# Patient Record
Sex: Male | Born: 1995 | Race: White | Hispanic: No | Marital: Single | Smoking: Current every day smoker
Health system: Southern US, Community
[De-identification: ages and names within clinical notes are randomized; demographics above are authoritative.]

## PROBLEM LIST (undated history)

## (undated) DIAGNOSIS — J45909 Unspecified asthma, uncomplicated: Secondary | ICD-10-CM

---
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
## 2013-04-11 ENCOUNTER — Emergency Department: Payer: Self-pay | Admitting: Emergency Medicine

## 2013-04-11 LAB — CBC
HCT: 47.6 % (ref 40.0–52.0)
HGB: 16.6 g/dL (ref 13.0–18.0)
MCH: 28.9 pg (ref 26.0–34.0)
MCHC: 34.7 g/dL (ref 32.0–36.0)
Platelet: 223 10*3/uL (ref 150–440)
RBC: 5.73 10*6/uL (ref 4.40–5.90)
RDW: 13.6 % (ref 11.5–14.5)

## 2013-04-11 LAB — DRUG SCREEN, URINE
Barbiturates, Ur Screen: NEGATIVE (ref ?–200)
Benzodiazepine, Ur Scrn: NEGATIVE (ref ?–200)
Cocaine Metabolite,Ur ~~LOC~~: NEGATIVE (ref ?–300)
MDMA (Ecstasy)Ur Screen: NEGATIVE (ref ?–500)
Methadone, Ur Screen: NEGATIVE (ref ?–300)
Opiate, Ur Screen: NEGATIVE (ref ?–300)
Phencyclidine (PCP) Ur S: NEGATIVE (ref ?–25)
Tricyclic, Ur Screen: NEGATIVE (ref ?–1000)

## 2013-04-11 LAB — URINALYSIS, COMPLETE
Bilirubin,UR: NEGATIVE
Blood: NEGATIVE
Glucose,UR: NEGATIVE mg/dL (ref 0–75)
Ketone: NEGATIVE
Leukocyte Esterase: NEGATIVE
Nitrite: NEGATIVE
Ph: 6 (ref 4.5–8.0)
RBC,UR: 1 /HPF (ref 0–5)
Specific Gravity: 1.02 (ref 1.003–1.030)
Squamous Epithelial: NONE SEEN
WBC UR: 1 /HPF (ref 0–5)

## 2013-04-11 LAB — COMPREHENSIVE METABOLIC PANEL
BUN: 13 mg/dL (ref 7–18)
Bilirubin,Total: 0.4 mg/dL (ref 0.2–1.0)
Chloride: 105 mmol/L (ref 98–107)
Creatinine: 0.91 mg/dL (ref 0.60–1.30)
EGFR (Non-African Amer.): >60
Glucose: 99 mg/dL (ref 65–99)
Osmolality: 274 (ref 275–301)
Potassium: 3.6 mmol/L (ref 3.5–5.1)
SGPT (ALT): 17 U/L (ref 12–78)
Total Protein: 7.8 g/dL (ref 6.4–8.6)

## 2013-04-11 LAB — ETHANOL: Ethanol %: <0.003 % (ref 0.000–0.080)

## 2013-04-11 LAB — TSH: Thyroid Stimulating Horm: 0.484 u[IU]/mL

## 2013-06-07 ENCOUNTER — Emergency Department: Payer: Self-pay | Admitting: Emergency Medicine

## 2013-06-18 ENCOUNTER — Emergency Department: Payer: Self-pay | Admitting: Emergency Medicine

## 2014-03-24 ENCOUNTER — Emergency Department: Payer: Self-pay | Admitting: Emergency Medicine

## 2014-11-29 ENCOUNTER — Encounter: Payer: Self-pay | Admitting: Emergency Medicine

## 2014-11-29 ENCOUNTER — Emergency Department
Admission: EM | Admit: 2014-11-29 | Discharge: 2014-11-29 | Disposition: A | Discharge status: Home / Self Care | Payer: Self-pay | Attending: Internal Medicine | Admitting: Internal Medicine

## 2014-11-29 DIAGNOSIS — Z720 Tobacco use: Secondary | ICD-10-CM | POA: Insufficient documentation

## 2014-11-29 DIAGNOSIS — L237 Allergic contact dermatitis due to plants, except food: Secondary | ICD-10-CM

## 2014-11-29 MED ORDER — HYDROXYZINE PAMOATE 25 MG PO CAPS: 25.0000 mg | ORAL_CAPSULE | ORAL | Status: DC | PRN

## 2014-11-29 MED ORDER — PREDNISONE 10 MG PO TABS
ORAL_TABLET | ORAL | Status: DC
Start: 2014-11-29 — End: 2015-08-27

## 2014-11-29 MED ORDER — METHYLPREDNISOLONE SODIUM SUCC 125 MG IJ SOLR
INTRAMUSCULAR | Status: AC
  Administered 2014-11-29: 125 mg via INTRAMUSCULAR
  Filled 2014-11-29: qty 4

## 2014-11-29 MED ORDER — SODIUM CHLORIDE 0.9 % IV SOLN: 250.0000 mg | Freq: Once | INTRAVENOUS | Status: DC

## 2014-11-29 MED ORDER — METHYLPREDNISOLONE SODIUM SUCC 125 MG IJ SOLR
125.0000 mg | Freq: Once | INTRAMUSCULAR | Status: AC
  Administered 2014-11-29: 125 mg via INTRAMUSCULAR

## 2014-11-29 NOTE — ED Notes (Signed)
C/o facial rash past 3 days, has some itching

## 2014-11-29 NOTE — Discharge Instructions (Signed)
Contact Dermatitis °Contact dermatitis is a rash that happens when something touches the skin. You touched something that irritates your skin, or you have allergies to something you touched. °HOME CARE  °· Avoid the thing that caused your rash. °· Keep your rash away from hot water, soap, sunlight, chemicals, and other things that might bother it. °· Do not scratch your rash. °· You can take cool baths to help stop itching. °· Only take medicine as told by your doctor. °· Keep all doctor visits as told. °GET HELP RIGHT AWAY IF:  °· Your rash is not better after 3 days. °· Your rash gets worse. °· Your rash is puffy (swollen), tender, red, sore, or warm. °· You have problems with your medicine. °MAKE SURE YOU:  °· Understand these instructions. °· Will watch your condition. °· Will get help right away if you are not doing well or get worse. °Document Released: 05/12/2009 Document Revised: 10/07/2011 Document Reviewed: 12/18/2010 °ExitCare® Patient Information ©2015 [employer]. This information is not intended to replace advice given to you by your health care provider. Make sure you discuss any questions you have with your health care provider. ° °

## 2014-11-29 NOTE — ED Provider Notes (Signed)
[facility] Emergency Department Provider Note    ____________________________________________  Time seen: 1655  I have reviewed the triage vital signs and the nursing notes.   HISTORY  Chief Complaint Poison Ivy     HPI Louis Doyle is a 19 y.o. male who presents to ER with complaints of poison ivy or poison oak to his face 3 days. Patient reports that is progressively getting worse located around both his eyes and his face [place] as well as his upper extremities complains of itching and burning sensation no similar symptoms however his girlfriend has been treated yesterday for the same.    History reviewed. No pertinent past medical history.  There are no active problems to display for this patient.   History reviewed. No pertinent past surgical history.  Current Outpatient Rx  Name  Route  Sig  Dispense  Refill  . hydrOXYzine (VISTARIL) 25 MG capsule   Oral   Take 1 capsule (25 mg total) by mouth every 4 (four) hours as needed for itching.   30 capsule   0   . predniSONE (DELTASONE) 10 MG tablet      Take 6 pills twice daily for 4 days, then 4 pills twice daily x 4 days, then 2 pills twice daily for 4 days, then 1 pill twice daily x 4 days, then 1 pill daily for 4 days.   108 tablet   0     Allergies Review of patient's allergies indicates no known allergies.  History reviewed. No pertinent family history.  Social History History  Substance Use Topics  . Smoking status: Current Every Day Smoker  . Smokeless tobacco: Not on file  . Alcohol Use: No    Review of Systems Constitutional: Negative for fever. Eyes: Negative for visual changes. ENT: Negative for sore throat. Cardiovascular: Negative for chest pain. Respiratory: Negative for shortness of breath. Gastrointestinal: Negative for abdominal pain, vomiting and diarrhea. Genitourinary: Negative for dysuria. Musculoskeletal: Negative for back pain. Skin: Skin is warm  and dry with rash and lesions located around the face and upper extremities vesicular in nature some weeping noted with crusting. Neurological: Negative for headaches, focal weakness or numbness.   10-point ROS otherwise negative.  ____________________________________________   PHYSICAL EXAM:  VITAL SIGNS: ED Triage Vitals  Enc Vitals Group     BP 11/29/14 1605 132/73 mmHg     Pulse Rate 11/29/14 1605 99     Resp --      Temp 11/29/14 1605 98.1 F (36.7 C)     Temp Source 11/29/14 1605 Oral     SpO2 11/29/14 1605 98 %     Weight 11/29/14 1605 185 lb (83.915 kg)     Height 11/29/14 1605  (1.854 m)     Head Cir --      Peak Flow --      Pain Score 11/29/14 1640 5     Pain Loc --      Pain Edu? --      Excl. in GC? --      Constitutional: Alert and oriented. Well appearing and in no distress. Eyes: Conjunctivae are normal. PERRL. Normal extraocular movements. No evidence of facial rash noted ENT   Head: Normocephalic and atraumatic.   Nose: No congestion/rhinnorhea.   Mouth/Throat: Mucous membranes are moist. Skin:  Skin is warm and dry with rash and lesions located around the face and upper extremities vesicular in nature some weeping noted with crusting. Psychiatric: Mood and affect  are normal. Speech and behavior are normal. Patient exhibits appropriate insight and judgment.  ____________________________________________    LABS (pertinent positives/negatives)    ____________________________________________   EKG    ____________________________________________    RADIOLOGY    ____________________________________________   PROCEDURES  Procedure(s) performed: None  Critical Care performed: No  ____________________________________________   INITIAL IMPRESSION / ASSESSMENT AND PLAN / ED COURSE  Pertinent labs & imaging results that were available during my care of the patient were reviewed by me and considered in my medical decision  making (see chart for details).    ____________________________________________   FINAL CLINICAL IMPRESSION(S) / ED DIAGNOSES  Final diagnoses:  Poison oak dermatitis     Evangeline DakinCharles M Anokhi Shannon, PA-C 11/29/14 1804

## 2015-08-27 ENCOUNTER — Emergency Department
Admission: EM | Admit: 2015-08-27 | Discharge: 2015-08-27 | Disposition: A | Discharge status: Home / Self Care | Payer: Self-pay | Attending: Emergency Medicine | Admitting: Emergency Medicine

## 2015-08-27 DIAGNOSIS — J039 Acute tonsillitis, unspecified: Secondary | ICD-10-CM | POA: Insufficient documentation

## 2015-08-27 DIAGNOSIS — J45909 Unspecified asthma, uncomplicated: Secondary | ICD-10-CM | POA: Insufficient documentation

## 2015-08-27 DIAGNOSIS — F17200 Nicotine dependence, unspecified, uncomplicated: Secondary | ICD-10-CM | POA: Insufficient documentation

## 2015-08-27 HISTORY — DX: Unspecified asthma, uncomplicated: J45.909

## 2015-08-27 LAB — POCT RAPID STREP A: Streptococcus, Group A Screen (Direct): NEGATIVE

## 2015-08-27 MED ORDER — PREDNISONE 10 MG PO TABS: 10.0000 mg | ORAL_TABLET | Freq: Two times a day (BID) | ORAL | Status: DC

## 2015-08-27 MED ORDER — DEXAMETHASONE SODIUM PHOSPHATE 10 MG/ML IJ SOLN
10.0000 mg | Freq: Once | INTRAMUSCULAR | Status: AC
  Administered 2015-08-27: 10 mg via INTRAMUSCULAR
  Filled 2015-08-27: qty 1

## 2015-08-27 NOTE — Discharge Instructions (Signed)
Tonsillitis Tonsillitis is an infection of the throat that causes the tonsils to become red, tender, and swollen. Tonsils are collections of lymphoid tissue at the back of the throat. Each tonsil has crevices (crypts). Tonsils help fight nose and throat infections and keep infection from spreading to other parts of the body for the first 18 months of life.  CAUSES Sudden (acute) tonsillitis is usually caused by infection with streptococcal bacteria. Long-lasting (chronic) tonsillitis occurs when the crypts of the tonsils become filled with pieces of food and bacteria, which makes it easy for the tonsils to become repeatedly infected. SYMPTOMS  Symptoms of tonsillitis include:  A sore throat, with possible difficulty swallowing.  White patches on the tonsils.  Fever.  Tiredness.  New episodes of snoring during sleep, when you did not snore before.  Small, foul-smelling, yellowish-white pieces of material (tonsilloliths) that you occasionally cough up or spit out. The tonsilloliths can also cause you to have bad breath. DIAGNOSIS Tonsillitis can be diagnosed through a physical exam. Diagnosis can be confirmed with the results of lab tests, including a throat culture. TREATMENT  The goals of tonsillitis treatment include the reduction of the severity and duration of symptoms and prevention of associated conditions. Symptoms of tonsillitis can be improved with the use of steroids to reduce the swelling. Tonsillitis caused by bacteria can be treated with antibiotic medicines. Usually, treatment with antibiotic medicines is started before the cause of the tonsillitis is known. However, if it is determined that the cause is not bacterial, antibiotic medicines will not treat the tonsillitis. If attacks of tonsillitis are severe and frequent, your health care provider may recommend surgery to remove the tonsils (tonsillectomy). HOME CARE INSTRUCTIONS   Rest as much as possible and get plenty of  sleep.  Drink plenty of fluids. While the throat is very sore, eat soft foods or liquids, such as sherbet, soups, or instant breakfast drinks.  Eat frozen ice pops.  Gargle with a warm or cold liquid to help soothe the throat. Mix 1/4 teaspoon of salt and 1/4 teaspoon of baking soda in 8 oz of water. SEEK MEDICAL CARE IF:   Large, tender lumps develop in your neck.  A rash develops.  A green, yellow-brown, or bloody substance is coughed up.  You are unable to swallow liquids or food for 24 hours.  You notice that only one of the tonsils is swollen. SEEK IMMEDIATE MEDICAL CARE IF:   You develop any new symptoms such as vomiting, severe headache, stiff neck, chest pain, or trouble breathing or swallowing.  You have severe throat pain along with drooling or voice changes.  You have severe pain, unrelieved with recommended medications.  You are unable to fully open the mouth.  You develop redness, swelling, or severe pain anywhere in the neck.  You have a fever. MAKE SURE YOU:   Understand these instructions.  Will watch your condition.  Will get help right away if you are not doing well or get worse.   This information is not intended to replace advice given to you by your health care provider. Make sure you discuss any questions you have with your health care provider.   Document Released: 04/24/2005 Document Revised: 08/05/2014 Document Reviewed: 01/01/2013 Elsevier Interactive Patient Education [employer]  Your throat culture is pending. You are being treated for enlarged tonsils, which may be viral in nature. Gargle with warm salty water as needed. Take OTC Tylenol as needed.

## 2015-08-27 NOTE — ED Notes (Signed)
Pt reports to ED w/ c/o sore throat.  Pt denies fever, SOB or CP.  Pt sts he has had runny nose and cough.

## 2015-08-27 NOTE — ED Provider Notes (Signed)
[facility] Emergency Department Provider Note ____________________________________________  Time seen: 77  I have reviewed the triage vital signs and the nursing notes.  HISTORY  Chief Complaint  Sore Throat  HPI Louis Doyle is a 20 y.o. male presents to the ED for evaluation of a sore throat complaints last 2-3 days. He describes painful, swollen tonsils which make it difficult to eat and drink. Denies any fevers, chills, sweats. He also denies any nausea, or vomiting. He has been dosing Advil intermittently for pain relief as also been using some cough drops for symptomatic relief. He gives a remote history of strep pharyngitis as a child. He denies any significant URI symptoms at this time, only noted in minor runny nose and a very mild intermittent cough.Patient is observed to be drinking [facility] in room prior to the interview. He notes the discomfort to his throat at an 8/10 in triage.  Past Medical History  Diagnosis Date  . Asthma     There are no active problems to display for this patient.   History reviewed. No pertinent past surgical history.  Current Outpatient Rx  Name  Route  Sig  Dispense  Refill  . hydrOXYzine (VISTARIL) 25 MG capsule   Oral   Take 1 capsule (25 mg total) by mouth every 4 (four) hours as needed for itching.   30 capsule   0   . predniSONE (DELTASONE) 10 MG tablet   Oral   Take 1 tablet (10 mg total) by mouth 2 (two) times daily with a meal.   10 tablet   0     Allergies Review of patient's allergies indicates no known allergies.  No family history on file.  Social History Social History  Substance Use Topics  . Smoking status: Current Every Day Smoker  . Smokeless tobacco: None  . Alcohol Use: No   Review of Systems  Constitutional: Negative for fever. Eyes: Negative for visual changes. ENT: Positive for sore throat. Cardiovascular: Negative for chest pain. Respiratory: Negative for  shortness of breath. Gastrointestinal: Negative for abdominal pain, vomiting and diarrhea. Genitourinary: Negative for dysuria. Musculoskeletal: Negative for back pain. Skin: Negative for rash. Neurological: Negative for headaches, focal weakness or numbness. ____________________________________________  PHYSICAL EXAM:  VITAL SIGNS: ED Triage Vitals  Enc Vitals Group     BP 08/27/15 1844 122/77 mmHg     Pulse Rate 08/27/15 1844 63     Resp 08/27/15 1844 16     Temp 08/27/15 1844 99.3 F (37.4 C)     Temp Source 08/27/15 1844 Oral     SpO2 08/27/15 1844 99 %     Weight --      Height --      Head Cir --      Peak Flow --      Pain Score 08/27/15 1845 8     Pain Loc --      Pain Edu? --      Excl. in GC? --    Constitutional: Alert and oriented. Well appearing and in no distress. Head: Normocephalic and atraumatic.      Eyes: Conjunctivae are normal. PERRL. Normal extraocular movements      Ears: Canals clear. TMs intact bilaterally.   Nose: No congestion/rhinorrhea.   Mouth/Throat: Mucous membranes are moist. Uvula is midline and tonsils are 2+. The right tonsil is appreciably larger than the left with some injection and erythema noted. Patient is able to control secretions and there is no indication  of exudate on the tonsils.   Neck: Supple. No thyromegaly. Hematological/Lymphatic/Immunological: No cervical lymphadenopathy. Cardiovascular: Normal rate, regular rhythm.  Respiratory: Normal respiratory effort. No wheezes/rales/rhonchi. Gastrointestinal: Soft and nontender. No distention. Musculoskeletal: Nontender with normal range of motion in all extremities.  Neurologic:  Normal gait without ataxia. Normal speech and language. No gross focal neurologic deficits are appreciated. Skin:  Skin is warm, dry and intact. No rash noted. Psychiatric: Mood and affect are normal. Patient exhibits appropriate insight and  judgment. ____________________________________________   LABS (pertinent positives/negatives) Labs Reviewed  CULTURE, GROUP A STREP [facility])  POCT RAPID STREP A   Rapid Strep - Negative ____________________________________________  PROCEDURES  Decadron 10 mg PO ____________________________________________  INITIAL IMPRESSION / ASSESSMENT AND PLAN / ED COURSE  Patient with an acute tonsillitis without indication of infectious process at this time. He may be subclinical or likely viral tonsillitis. We treated with prednisone to dose the next 5 days for inflammation and edema. He will be notified and 24 hours of the pending throat culture results. Work note is provided for 1 day as requested. He is also encouraged to gargle with warm salt water and dosed Tylenol over-the-counter for return to the ED for acutely worsening symptoms as discussed. ____________________________________________  FINAL CLINICAL IMPRESSION(S) / ED DIAGNOSES  Final diagnoses:  Tonsillitis      Lissa Hoard, PA-C 08/27/15 1935  Jennye Moccasin, MD 08/27/15 2056

## 2015-08-29 LAB — CULTURE, GROUP A STREP (THRC)

## 2015-09-11 ENCOUNTER — Emergency Department
Admission: EM | Admit: 2015-09-11 | Discharge: 2015-09-11 | Disposition: A | Discharge status: Home / Self Care | Payer: Self-pay | Attending: Emergency Medicine | Admitting: Emergency Medicine

## 2015-09-11 ENCOUNTER — Encounter: Payer: Self-pay | Admitting: Emergency Medicine

## 2015-09-11 DIAGNOSIS — F17200 Nicotine dependence, unspecified, uncomplicated: Secondary | ICD-10-CM | POA: Insufficient documentation

## 2015-09-11 DIAGNOSIS — Z7952 Long term (current) use of systemic steroids: Secondary | ICD-10-CM | POA: Insufficient documentation

## 2015-09-11 DIAGNOSIS — J039 Acute tonsillitis, unspecified: Secondary | ICD-10-CM | POA: Insufficient documentation

## 2015-09-11 LAB — MONONUCLEOSIS SCREEN: Mono Screen: NEGATIVE

## 2015-09-11 MED ORDER — LIDOCAINE VISCOUS 2 % MT SOLN
15.0000 mL | Freq: Once | OROMUCOSAL | Status: AC
  Administered 2015-09-11: 15 mL via OROMUCOSAL
  Filled 2015-09-11: qty 15

## 2015-09-11 MED ORDER — LIDOCAINE VISCOUS 2 % MT SOLN: 5.0000 mL | Freq: Four times a day (QID) | OROMUCOSAL | Status: DC | PRN

## 2015-09-11 MED ORDER — PENICILLIN G BENZATHINE 1200000 UNIT/2ML IM SUSP
1.2000 10*6.[IU] | Freq: Once | INTRAMUSCULAR | Status: AC
  Administered 2015-09-11: 1.2 10*6.[IU] via INTRAMUSCULAR
  Filled 2015-09-11: qty 2

## 2015-09-11 MED ORDER — PSEUDOEPH-BROMPHEN-DM 30-2-10 MG/5ML PO SYRP
5.0000 mL | ORAL_SOLUTION | Freq: Four times a day (QID) | ORAL | Status: DC | PRN
Start: 2015-09-11 — End: 2017-08-27

## 2015-09-11 MED ORDER — DIPHENHYDRAMINE HCL 12.5 MG/5ML PO ELIX
25.0000 mg | ORAL_SOLUTION | Freq: Once | ORAL | Status: AC
  Administered 2015-09-11: 25 mg via ORAL
  Filled 2015-09-11: qty 10

## 2015-09-11 NOTE — Discharge Instructions (Signed)
Tonsillitis °Tonsillitis is an infection of the throat. This infection causes the tonsils to become red, tender, and puffy (swollen). Tonsils are groups of tissue at the back of your throat. If bacteria caused your infection, antibiotic medicine will be given to you. Sometimes symptoms of tonsillitis can be relieved with the use of steroid medicine. If your tonsillitis is severe and happens often, you may need to get your tonsils removed (tonsillectomy). °HOME CARE  °· Rest and sleep often. °· Drink enough fluids to keep your pee (urine) clear or pale yellow. °· While your throat is sore, eat soft or liquid foods like: °¨ Soup. °¨ Ice cream. °¨ Instant breakfast drinks. °· Eat frozen ice pops. °· Gargle with a warm or cold liquid to help soothe the throat. Gargle with a water and salt mix. Mix 1/4 teaspoon of salt and 1/4 teaspoon of baking soda in 1 cup of water. °· Only take medicines as told by your doctor. °· If you are given medicines (antibiotics), take them as told. Finish them even if you start to feel better. °GET HELP IF: °· You have large, tender lumps in your neck. °· You have a rash. °· You cough up green, yellow-brown, or bloody fluid. °· You cannot swallow liquids or food for 24 hours. °· You notice that only one of your tonsils is swollen. °GET HELP RIGHT AWAY IF:  °· You throw up (vomit). °· You have a very bad headache. °· You have a stiff neck. °· You have chest pain. °· You have trouble breathing or swallowing. °· You have bad throat pain, drooling, or your voice changes. °· You have bad pain not helped by medicine. °· You cannot fully open your mouth. °· You have redness, puffiness, or bad pain in the neck. °· You have a fever. °MAKE SURE YOU:  °· Understand these instructions. °· Will watch your condition. °· Will get help right away if you are not doing well or get worse. °  °This information is not intended to replace advice given to you by your health care provider. Make sure you discuss any  questions you have with your health care provider. °  °Document Released: 01/01/2008 Document Revised: 07/20/2013 Document Reviewed: 01/01/2013 °Elsevier Interactive Patient Education ©2016 Elsevier Inc. ° °

## 2015-09-11 NOTE — ED Notes (Signed)
POCT Rapid Strep negative  

## 2015-09-11 NOTE — ED Notes (Signed)
Sore throat for about 3 weeks   Min relief with meds became worse last pm

## 2015-09-11 NOTE — ED Provider Notes (Signed)
[facility] Emergency Department Provider Note  ____________________________________________  Time seen: Approximately 12:15 PM  I have reviewed the triage vital signs and the nursing notes.   HISTORY  Chief Complaint Sore Throat    HPI Louis Doyle is a 20 y.o. male patient complaining of 3 weeks of sore throat. Patient seen twice in the ER and both times the rapid strep was negative. Patient's status and transient relief has been prescribed steroids which has decreased the swelling in his throat. Tonsils remain exudative. Patient denies any fever associated this complaint was states increased fatigue. No palliative measures taken for this complaint. Patient is rating his pain discomfort as a 10 over 10.   Past Medical History  Diagnosis Date  . Asthma     There are no active problems to display for this patient.   History reviewed. No pertinent past surgical history.  Current Outpatient Rx  Name  Route  Sig  Dispense  Refill  . brompheniramine-pseudoephedrine-DM 30-2-10 MG/5ML syrup   Oral   Take 5 mLs by mouth 4 (four) times daily as needed. Mixed with 5 ML's of viscous lidocaine for swish and swallow.   120 mL   0   . hydrOXYzine (VISTARIL) 25 MG capsule   Oral   Take 1 capsule (25 mg total) by mouth every 4 (four) hours as needed for itching.   30 capsule   0   . lidocaine (XYLOCAINE) 2 % solution   Mouth/Throat   Use as directed 5 mLs in the mouth or throat every 6 (six) hours as needed for mouth pain. Mixed with 5 ML of  Bromfed-DM for swish and swallow.   100 mL   0   . predniSONE (DELTASONE) 10 MG tablet   Oral   Take 1 tablet (10 mg total) by mouth 2 (two) times daily with a meal.   10 tablet   0     Allergies Review of patient's allergies indicates no known allergies.  No family history on file.  Social History Social History  Substance Use Topics  . Smoking status: Current Every Day Smoker  . Smokeless  tobacco: None  . Alcohol Use: No    Review of Systems Constitutional: No fever/chills Eyes: No visual changes. ENT: Sore throat and nasal congestion Cardiovascular: Denies chest pain. Respiratory: Denies shortness of breath. Productive cough Gastrointestinal: No abdominal pain.  No nausea, no vomiting.  No diarrhea.  No constipation. Genitourinary: Negative for dysuria. Musculoskeletal: Negative for back pain. Skin: Negative for rash. Neurological: Negative for headaches, focal weakness or numbness.   ____________________________________________   PHYSICAL EXAM:  VITAL SIGNS: ED Triage Vitals  Enc Vitals Group     BP 09/11/15 1129 132/73 mmHg     Pulse Rate 09/11/15 1129 57     Resp 09/11/15 1129 16     Temp 09/11/15 1129 97.6 F (36.4 C)     Temp Source 09/11/15 1129 Oral     SpO2 09/11/15 1129 100 %     Weight 09/11/15 1129 185 lb (83.915 kg)     Height 09/11/15 1129  (1.854 m)     Head Cir --      Peak Flow --      Pain Score 09/11/15 1126 10     Pain Loc --      Pain Edu? --      Excl. in GC? --     Constitutional: Alert and oriented. Well appearing and in no acute distress. Eyes:  Conjunctivae are normal. PERRL. EOMI. Head: Atraumatic. Nose: No congestion/rhinnorhea. Mouth/Throat: Mucous membranes are moist.  Oropharynx erythematous exudative edematous tonsils Neck: No stridor.  No cervical spine tenderness to palpation. Hematological/Lymphatic/Immunilogical: Bilateral cervical lymphadenopathy. ardiovascular: Normal rate, regular rhythm. Grossly normal heart sounds.  Good peripheral circulation. Respiratory: Normal respiratory effort.  No retractions. Lungs CTAB. Gastrointestinal: Soft and nontender. No distention. No abdominal bruits. No CVA tenderness. Musculoskeletal: No lower extremity tenderness nor edema.  No joint effusions. Neurologic:  Normal speech and language. No gross focal neurologic deficits are appreciated. No gait instability. Skin:   Skin is warm, dry and intact. No rash noted. Psychiatric: Mood and affect are normal. Speech and behavior are normal.  ____________________________________________   LABS (all labs ordered are listed, but only abnormal results are displayed)  Labs Reviewed  CULTURE, GROUP A STREP [facility])  MONONUCLEOSIS SCREEN   ____________________________________________  EKG   ____________________________________________  RADIOLOGY   ____________________________________________   PROCEDURES  Procedure(s) performed: None  Critical Care performed: No  ____________________________________________   INITIAL IMPRESSION / ASSESSMENT AND PLAN / ED COURSE  Pertinent labs & imaging results that were available during my care of the patient were reviewed by me and considered in my medical decision making (see chart for details).  Tonsillitis. Patient has a negative rapid strep and negative Monospot test today. Patient was given Bicillin in the ER today and a prescription for prone for DM of viscous lidocaine. Patient advised to follow-up with the ENT clinic if condition persists. ____________________________________________   FINAL CLINICAL IMPRESSION(S) / ED DIAGNOSES  Final diagnoses:  Exudative tonsillitis      Joni Reining, PA-C 09/11/15 1406  Rockne Menghini, MD 09/11/15 1529

## 2015-09-14 LAB — CULTURE, GROUP A STREP (THRC)

## 2016-03-30 ENCOUNTER — Emergency Department: Payer: No Typology Code available for payment source

## 2016-03-30 ENCOUNTER — Emergency Department
Admission: EM | Admit: 2016-03-30 | Discharge: 2016-03-30 | Disposition: A | Discharge status: Home / Self Care | Payer: No Typology Code available for payment source | Attending: Emergency Medicine | Admitting: Emergency Medicine

## 2016-03-30 DIAGNOSIS — Z79899 Other long term (current) drug therapy: Secondary | ICD-10-CM | POA: Diagnosis not present

## 2016-03-30 DIAGNOSIS — J45909 Unspecified asthma, uncomplicated: Secondary | ICD-10-CM | POA: Insufficient documentation

## 2016-03-30 DIAGNOSIS — M791 Myalgia: Secondary | ICD-10-CM | POA: Diagnosis not present

## 2016-03-30 DIAGNOSIS — F17200 Nicotine dependence, unspecified, uncomplicated: Secondary | ICD-10-CM | POA: Diagnosis not present

## 2016-03-30 DIAGNOSIS — Y92410 Unspecified street and highway as the place of occurrence of the external cause: Secondary | ICD-10-CM | POA: Insufficient documentation

## 2016-03-30 DIAGNOSIS — Y999 Unspecified external cause status: Secondary | ICD-10-CM | POA: Diagnosis not present

## 2016-03-30 DIAGNOSIS — Y9389 Activity, other specified: Secondary | ICD-10-CM | POA: Insufficient documentation

## 2016-03-30 DIAGNOSIS — M79604 Pain in right leg: Secondary | ICD-10-CM | POA: Diagnosis present

## 2016-03-30 DIAGNOSIS — M7918 Myalgia, other site: Secondary | ICD-10-CM

## 2016-03-30 MED ORDER — IBUPROFEN 800 MG PO TABS: 800.0000 mg | ORAL_TABLET | Freq: Three times a day (TID) | ORAL | 0 refills | Status: DC | PRN

## 2016-03-30 MED ORDER — HYDROCODONE-ACETAMINOPHEN 5-325 MG PO TABS: 1.0000 | ORAL_TABLET | Freq: Four times a day (QID) | ORAL | 0 refills | Status: DC | PRN

## 2016-03-30 MED ORDER — HYDROCODONE-ACETAMINOPHEN 5-325 MG PO TABS
1.0000 | ORAL_TABLET | Freq: Once | ORAL | Status: AC
  Administered 2016-03-30: 1 via ORAL
  Filled 2016-03-30: qty 1

## 2016-03-30 MED ORDER — IBUPROFEN 800 MG PO TABS
800.0000 mg | ORAL_TABLET | Freq: Once | ORAL | Status: AC
  Administered 2016-03-30: 800 mg via ORAL
  Filled 2016-03-30: qty 1
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
## 2016-03-30 NOTE — Discharge Instructions (Signed)
1. You may take pain medicines as needed (Motrin/Norco). 2. Apply ice to affected area several times daily. 3. Return to the ER for worsening symptoms, persistent vomiting, difficulty breathing or other concerns.

## 2017-08-27 ENCOUNTER — Emergency Department
Admission: EM | Admit: 2017-08-27 | Discharge: 2017-08-27 | Disposition: A | Discharge status: Home / Self Care | Payer: Self-pay | Attending: Emergency Medicine | Admitting: Emergency Medicine

## 2017-08-27 DIAGNOSIS — J45909 Unspecified asthma, uncomplicated: Secondary | ICD-10-CM | POA: Insufficient documentation

## 2017-08-27 DIAGNOSIS — F17200 Nicotine dependence, unspecified, uncomplicated: Secondary | ICD-10-CM | POA: Insufficient documentation

## 2017-08-27 DIAGNOSIS — J020 Streptococcal pharyngitis: Secondary | ICD-10-CM | POA: Insufficient documentation

## 2017-08-27 LAB — GROUP A STREP BY PCR: Group A Strep by PCR: DETECTED — AB

## 2017-08-27 MED ORDER — AZITHROMYCIN 250 MG PO TABS: ORAL_TABLET | ORAL | 0 refills | Status: DC

## 2017-08-27 NOTE — ED Provider Notes (Signed)
[facility] Emergency Department Provider Note  ____________________________________________   First MD Initiated Contact with Patient 08/27/17 [phone]     (approximate)  I have reviewed the triage vital signs and the nursing notes.   HISTORY  Chief Complaint Sore Throat  HPI Louis Doyle is a 22 y.o. male here with complaint of sore throat for the last 2 days.  He states that he has not actually taken his temperature but has felt feverish and had chills at home.  He denies any body aches.  He has taken an occasional over-the-counter medication without any relief of his throat pain.  He rates his pain as an 8 out of 10.   Past Medical History:  Diagnosis Date  . Asthma     There are no active problems to display for this patient.   No past surgical history on file.  Prior to Admission medications   Medication Sig Start Date End Date Taking? Authorizing Provider  azithromycin (ZITHROMAX Z-PAK) 250 MG tablet Take 2 tablets (500 mg) on  Day 1,  followed by 1 tablet (250 mg) once daily on Days 2 through 5. 08/27/17   Tommi RumpsSummers, Mikaya Bunner L, PA-C    Allergies Amoxil [amoxicillin]  No family history on file.  Social History Social History   Tobacco Use  . Smoking status: Current Every Day Smoker  Substance Use Topics  . Alcohol use: No  . Drug use: Not on file    Review of Systems Constitutional: No fever/chills Eyes: No visual changes. ENT: Positive sore throat. Cardiovascular: Denies chest pain. Respiratory: Denies shortness of breath. Gastrointestinal: No abdominal pain.  No nausea, no vomiting.  Musculoskeletal: Negative for back pain. Skin: Negative for rash. Neurological: Negative for headaches, focal weakness or numbness. ___________________________________________   PHYSICAL EXAM:  VITAL SIGNS: ED Triage Vitals  Enc Vitals Group     BP 08/27/17 0904 118/68     Pulse Rate 08/27/17 0904 88     Resp 08/27/17 0904 18     Temp  08/27/17 0904 99.1 F (37.3 C)     Temp Source 08/27/17 0904 Oral     SpO2 08/27/17 0904 97 %     Weight 08/27/17 0903 195 lb (88.5 kg)     Height 08/27/17 0903 6\' 2"  (1.88 m)     Head Circumference --      Peak Flow --      Pain Score 08/27/17 0903 8     Pain Loc --      Pain Edu? --      Excl. in GC? --    Constitutional: Alert and oriented. Well appearing and in no acute distress. Eyes: Conjunctivae are normal. Head: Atraumatic. Nose: No congestion/rhinnorhea.  EACs and TMs are clear. Mouth/Throat: Mucous membranes are moist.  Oropharynx erythematous with enlarged tonsils bilaterally.  Uvula is still midline.  There is some exudate present. Neck: No stridor.   Hematological/Lymphatic/Immunilogical: Moderate bilateral cervical lymphadenopathy. Cardiovascular: Normal rate, regular rhythm. Grossly normal heart sounds.  Good peripheral circulation. Respiratory: Normal respiratory effort.  No retractions. Lungs CTAB. Musculoskeletal: Moves upper and lower extremities without any difficulty.  Normal gait was noted. Neurologic:  Normal speech and language. No gross focal neurologic deficits are appreciated.  Skin:  Skin is warm, dry and intact. No rash noted. Psychiatric: Mood and affect are normal. Speech and behavior are normal.  ____________________________________________   LABS (all labs ordered are listed, but only abnormal results are displayed)  Labs Reviewed  GROUP A  STREP BY PCR - Abnormal; Notable for the following components:      Result Value   Group A Strep by PCR DETECTED (*)    All other components within normal limits     PROCEDURES  Procedure(s) performed: None  Procedures  Critical Care performed: No  ____________________________________________   INITIAL IMPRESSION / ASSESSMENT AND PLAN / ED COURSE  Patient was made aware that his strep test was positive.  He was given a prescription for a Z-Pak as he has allergies to amoxicillin.  Patient is aware  take Tylenol or ibuprofen as needed for throat pain and fever.  He is to increase fluids.  He will follow-up with [facility] acute care if any continued problems. ____________________________________________   FINAL CLINICAL IMPRESSION(S) / ED DIAGNOSES  Final diagnoses:  Strep pharyngitis     ED Discharge Orders        Ordered    azithromycin (ZITHROMAX Z-PAK) 250 MG tablet     08/27/17 1012       Note:  This document was prepared using Dragon voice recognition software and may include unintentional dictation errors.    Tommi Rumps, PA-C 08/27/17 1037    Emily Filbert, MD 08/27/17 [phone]

## 2017-08-27 NOTE — Discharge Instructions (Signed)
[facility]  Continue taking Tylenol or ibuprofen as needed for throat pain or fever.  Begin taking Zithromax today and every day for the next 5 days.  This medication will stay in your system for 10 days.  You are contagious for 24 hours.

## 2017-08-27 NOTE — ED Triage Notes (Signed)
Pt to ED with c/o of sore throat for 2 days.

## 2017-08-27 NOTE — ED Notes (Signed)
Pt resting in bed, appears in no distress

## 2018-01-23 ENCOUNTER — Emergency Department: Payer: Self-pay

## 2018-01-23 ENCOUNTER — Emergency Department
Admission: EM | Admit: 2018-01-23 | Discharge: 2018-01-24 | Disposition: A | Discharge status: Home / Self Care | Payer: Self-pay | Attending: Emergency Medicine | Admitting: Emergency Medicine

## 2018-01-23 ENCOUNTER — Other Ambulatory Visit: Payer: Self-pay

## 2018-01-23 DIAGNOSIS — N5089 Other specified disorders of the male genital organs: Secondary | ICD-10-CM | POA: Insufficient documentation

## 2018-01-23 DIAGNOSIS — F17200 Nicotine dependence, unspecified, uncomplicated: Secondary | ICD-10-CM | POA: Insufficient documentation

## 2018-01-23 DIAGNOSIS — N5082 Scrotal pain: Secondary | ICD-10-CM | POA: Insufficient documentation

## 2018-01-23 LAB — URINALYSIS, COMPLETE (UACMP) WITH MICROSCOPIC
BACTERIA UA: NONE SEEN
BILIRUBIN URINE: NEGATIVE
Glucose, UA: NEGATIVE mg/dL
HGB URINE DIPSTICK: NEGATIVE
KETONES UR: NEGATIVE mg/dL
LEUKOCYTES UA: NEGATIVE
NITRITE: NEGATIVE
Protein, ur: NEGATIVE mg/dL
Specific Gravity, Urine: 1.011 (ref 1.005–1.030)
WBC, UA: NONE SEEN WBC/hpf (ref 0–5)
pH: 6 (ref 5.0–8.0)

## 2018-01-23 NOTE — ED Triage Notes (Addendum)
Pt arrives to ED via POV from home with c/o groin swelling x1 month. Pt denies any related urinary s/x's. 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 Pt states pain is intermittent without cause. Pt is unsure if ever exposed to an STD, "I haven't used protection the last few times".

## 2018-01-24 LAB — CHLAMYDIA/NGC RT PCR (ARMC ONLY)
CHLAMYDIA TR: NOT DETECTED
N GONORRHOEAE: NOT DETECTED

## 2018-01-24 MED ORDER — DOXYCYCLINE HYCLATE 100 MG PO TABS
100.0000 mg | ORAL_TABLET | Freq: Once | ORAL | Status: AC
  Administered 2018-01-24: 100 mg via ORAL
  Filled 2018-01-24: qty 1

## 2018-01-24 MED ORDER — AZITHROMYCIN 1 G PO PACK
1.0000 g | PACK | Freq: Once | ORAL | Status: AC
  Administered 2018-01-24: 1 g via ORAL
  Filled 2018-01-24: qty 1

## 2018-01-24 MED ORDER — KETOROLAC TROMETHAMINE 60 MG/2ML IM SOLN
60.0000 mg | Freq: Once | INTRAMUSCULAR | Status: AC
  Administered 2018-01-24: 60 mg via INTRAMUSCULAR
  Filled 2018-01-24: qty 2

## 2018-01-24 MED ORDER — DOXYCYCLINE HYCLATE 100 MG PO TABS: 100.0000 mg | ORAL_TABLET | Freq: Two times a day (BID) | ORAL | 0 refills | Status: DC

## 2018-01-24 NOTE — ED Provider Notes (Signed)
[facility] Emergency Department Provider Note   ____________________________________________   First MD Initiated Contact with Patient 01/23/18 2319     (approximate)  I have reviewed the triage vital signs and the nursing notes.   HISTORY  Chief Complaint Groin Swelling    HPI Louis Doyle is a 22 y.o. male who comes into the hospital today with some testicle swelling and pain.  The patient is unsure exactly when it started.  He reports that he has had some pain on and off for about a month but he is been ignoring it.  He states it is been getting worse and today it was a 5 out of 10 in intensity.  He also reports that his right testicle is bigger than his left testicle.  He is been taking ibuprofen without any relief.  The patient denies any burning with urination or discharge.  The patient states though that he has been having some unprotected sex.  He is here today for evaluation.  Past Medical History:  Diagnosis Date  . Asthma     There are no active problems to display for this patient.   History reviewed. No pertinent surgical history.  Prior to Admission medications   Medication Sig Start Date End Date Taking? Authorizing Provider  azithromycin (ZITHROMAX Z-PAK) 250 MG tablet Take 2 tablets (500 mg) on  Day 1,  followed by 1 tablet (250 mg) once daily on Days 2 through 5. 08/27/17   Tommi Rumps, PA-C  doxycycline (VIBRA-TABS) 100 MG tablet Take 1 tablet (100 mg total) by mouth 2 (two) times daily. 01/24/18   Rebecka Apley, MD    Allergies Amoxil [amoxicillin]  No family history on file.  Social History Social History   Tobacco Use  . Smoking status: Current Every Day Smoker  . Smokeless tobacco: Never Used  Substance Use Topics  . Alcohol use: No  . Drug use: Not on file    Review of Systems  Constitutional: No fever/chills Eyes: No visual changes. ENT: No sore throat. Cardiovascular: Denies chest  pain. Respiratory: Denies shortness of breath. Gastrointestinal: No abdominal pain.  No nausea, no vomiting.  No diarrhea.  No constipation. Genitourinary:right testicle swelling and pain Musculoskeletal: Negative for back pain. Skin: Negative for rash. Neurological: Negative for headaches, focal weakness or numbness.   ____________________________________________   PHYSICAL EXAM:  VITAL SIGNS: ED Triage Vitals  Enc Vitals Group     BP 01/23/18 2201 (!) 150/91     Pulse Rate 01/23/18 2201 71     Resp 01/23/18 2201 18     Temp 01/23/18 2201 98.3 F (36.8 C)     Temp Source 01/23/18 2201 Oral     SpO2 01/23/18 2201 98 %     Weight 01/23/18 2204 190 lb (86.2 kg)     Height 01/23/18 2204 6\' 2"  (1.88 m)     Head Circumference --      Peak Flow --      Pain Score 01/23/18 2209 3     Pain Loc --      Pain Edu? --      Excl. in GC? --     Constitutional: Alert and oriented. Well appearing and in mild distress. Eyes: Conjunctivae are normal. PERRL. EOMI. Head: Atraumatic. Nose: No congestion/rhinnorhea. Mouth/Throat: Mucous membranes are moist.  Oropharynx non-erythematous. Cardiovascular: Normal rate, regular rhythm. Grossly normal heart sounds.  Good peripheral circulation. Respiratory: Normal respiratory effort.  No retractions. Lungs CTAB. Gastrointestinal: Soft  and nontender. No distention. Positive bowel sounds Genitourinary: right testicle larger than the left with no significant tenderness to palpation or swelling. No penile discharge. Musculoskeletal: No lower extremity tenderness nor edema.  Neurologic:  Normal speech and language.  Skin:  Skin is warm, dry and intact.  Psychiatric: Mood and affect are normal.   ____________________________________________   LABS (all labs ordered are listed, but only abnormal results are displayed)  Labs Reviewed  URINALYSIS, COMPLETE (UACMP) WITH MICROSCOPIC - Abnormal; Notable for the following components:      Result Value    Color, Urine STRAW (*)    APPearance CLEAR (*)    All other components within normal limits  CHLAMYDIA/NGC RT PCR ([facility] ONLY)   ____________________________________________  EKG  none ____________________________________________  RADIOLOGY  ED MD interpretation:  US scrotum: Unremarkable testicular ultrasound, Doppler detected arterial and venous flow bilaterally  Official radiology report(s): [place] Scrotum  Result Date: 01/24/2018 CLINICAL DATA:  22 year old male with left testicular edema x1 week. EXAM: SCROTAL ULTRASOUND DOPPLER ULTRASOUND OF THE TESTICLES TECHNIQUE: Complete ultrasound examination of the testicles, epididymis, and other scrotal structures was performed. Color and spectral Doppler ultrasound were also utilized to evaluate blood flow to the testicles. COMPARISON:  None. FINDINGS: Right testicle Measurements: 5.6 x 2.4 x 3.3 cm. No mass or microlithiasis visualized. Left testicle Measurements: 4.8 x 1.9 x 2.7 cm. A subcentimeter echogenic area measured in the testicle corresponds to the testicular mediastinum and rete testes. Right epididymis:  Normal in size and appearance. Left epididymis:  Normal in size and appearance. Hydrocele:  None visualized. Varicocele:  None visualized. Pulsed Doppler interrogation of both testes demonstrates normal low resistance arterial and venous waveforms bilaterally. IMPRESSION: Unremarkable testicular ultrasound. Doppler detected arterial and venous flow bilaterally. Electronically Signed   By: Elgie CollardArash  Radparvar M.D.   On: 01/24/2018 00:30    ____________________________________________   PROCEDURES  Procedure(s) performed: None  Procedures  Critical Care performed: No  ____________________________________________   INITIAL IMPRESSION / ASSESSMENT AND PLAN / ED COURSE  As part of my medical decision making, I reviewed the following data within the electronic MEDICAL RECORD NUMBER Notes from prior ED visits and [place]  Controlled Substance Database   This is a 22 year old male who comes into the hospital today with some testicular swelling and pain.  My differential diagnosis includes epididymitis, orchitis, urinary tract infection, testicular torsion.  I did send the patient for an ultrasound which was unremarkable.  The patient also had a urinalysis that was negative and a GC and chlamydia which were ordered.  The patient's results were negative but I did give the patient a dose of azithromycin and doxycycline in the event that he did have an infection.  The results of his GC and chlamydia had not resulted by the time he was discharged.  The patient has been encouraged to follow-up with urology.  He is to return with any worsening symptoms.  The patient also received a shot of Toradol.      ____________________________________________   FINAL CLINICAL IMPRESSION(S) / ED DIAGNOSES  Final diagnoses:  Scrotal pain     ED Discharge Orders        Ordered    doxycycline (VIBRA-TABS) 100 MG tablet  2 times daily     01/24/18 0139       Note:  This document was prepared using Dragon voice recognition software and may include unintentional dictation errors.    Rebecka ApleyWebster, Ananda Caya P, MD 01/24/18 [phone]

## 2018-01-24 NOTE — Discharge Instructions (Addendum)
Please follow up with urology for further evaluation of your scrotal pain and swelling

## 2018-02-25 ENCOUNTER — Emergency Department: Payer: Self-pay

## 2018-02-25 ENCOUNTER — Encounter: Payer: Self-pay | Admitting: *Deleted

## 2018-02-25 ENCOUNTER — Emergency Department
Admission: EM | Admit: 2018-02-25 | Discharge: 2018-02-25 | Disposition: A | Discharge status: Home / Self Care | Payer: Self-pay | Attending: Emergency Medicine | Admitting: Emergency Medicine

## 2018-02-25 ENCOUNTER — Other Ambulatory Visit: Payer: Self-pay

## 2018-02-25 DIAGNOSIS — N5089 Other specified disorders of the male genital organs: Secondary | ICD-10-CM | POA: Insufficient documentation

## 2018-02-25 DIAGNOSIS — Z87891 Personal history of nicotine dependence: Secondary | ICD-10-CM | POA: Insufficient documentation

## 2018-02-25 DIAGNOSIS — J45909 Unspecified asthma, uncomplicated: Secondary | ICD-10-CM | POA: Insufficient documentation

## 2018-02-25 LAB — URINALYSIS, COMPLETE (UACMP) WITH MICROSCOPIC
Bacteria, UA: NONE SEEN
Bilirubin Urine: NEGATIVE
GLUCOSE, UA: NEGATIVE mg/dL
HGB URINE DIPSTICK: NEGATIVE
Ketones, ur: 5 mg/dL — AB
Leukocytes, UA: NEGATIVE
Nitrite: NEGATIVE
PH: 5 (ref 5.0–8.0)
Protein, ur: NEGATIVE mg/dL
SPECIFIC GRAVITY, URINE: 1.03 (ref 1.005–1.030)

## 2018-02-25 MED ORDER — PREDNISONE 10 MG PO TABS: 10.0000 mg | ORAL_TABLET | Freq: Two times a day (BID) | ORAL | 0 refills | Status: DC

## 2018-02-25 MED ORDER — DEXAMETHASONE SODIUM PHOSPHATE 10 MG/ML IJ SOLN
10.0000 mg | Freq: Once | INTRAMUSCULAR | Status: AC
  Administered 2018-02-25: 10 mg via INTRAMUSCULAR
  Filled 2018-02-25: qty 1

## 2018-02-25 NOTE — ED Provider Notes (Signed)
[facility] Emergency Department Provider Note ____________________________________________  Time seen: 1818  I have reviewed the triage vital signs and the nursing notes.  HISTORY  Chief Complaint  Testicle Pain  HPI Louis Doyle is a 22 y.o. male presents himself to the ED for evaluation of continued right testicular swelling.  Patient was evaluated here about 4 weeks prior for the same complaint.  He was reassured by his negative GC chlamydia test, urinalysis, and ultrasound.  He was treated empirically for epididymitis with a dose of azithromycin and a 10-day course of doxycycline but he completed the medication, despite developing an itchy rash.  He presents today noting no significant change in the swelling he noted to the testicle.  He denies any dysuria, hematuria, or urinary retention, penile discharge, or bloody ejaculate.  He does note some increased fullness to the same area when his penis is engorged prior to intercourse.  He denies any painful intercourse.  Patient presents for ongoing symptoms.  He has not been evaluated by urology in the interim, but does admit to an appointment for August 22.  Past Medical History:  Diagnosis Date  . Asthma     There are no active problems to display for this patient.   History reviewed. No pertinent surgical history.  Prior to Admission medications   Medication Sig Start Date End Date Taking? Authorizing Provider  predniSONE (DELTASONE) 10 MG tablet Take 1 tablet (10 mg total) by mouth 2 (two) times daily with a meal. 02/25/18   Antonino Nienhuis, Charlesetta Ivory, PA-C    Allergies Amoxil [amoxicillin]  History reviewed. No pertinent family history.  Social History Social History   Tobacco Use  . Smoking status: Former Games developer  . Smokeless tobacco: Never Used  Substance Use Topics  . Alcohol use: No  . Drug use: Never    Review of Systems  Constitutional: Negative for fever. Eyes: Negative for visual  changes. ENT: Negative for sore throat. Cardiovascular: Negative for chest pain. Respiratory: Negative for shortness of breath. Gastrointestinal: Negative for abdominal pain, vomiting and diarrhea. Genitourinary: Negative for dysuria.  Scrotal swelling as above. Musculoskeletal: Negative for back pain. Skin: Negative for rash. Neurological: Negative for headaches, focal weakness or numbness. ____________________________________________  PHYSICAL EXAM:  VITAL SIGNS: ED Triage Vitals  Enc Vitals Group     BP 02/25/18 1745 (!) 153/78     Pulse Rate 02/25/18 1745 71     Resp 02/25/18 2016 18     Temp 02/25/18 1745 98.1 F (36.7 C)     Temp Source 02/25/18 1745 Oral     SpO2 02/25/18 1745 98 %     Weight 02/25/18 1746 215 lb (97.5 kg)     Height 02/25/18 1746 6\' 2"  (1.88 m)     Head Circumference --      Peak Flow --      Pain Score 02/25/18 1803 7     Pain Loc --      Pain Edu? --      Excl. in GC? --     Constitutional: Alert and oriented. Well appearing and in no distress. Head: Normocephalic and atraumatic. Cardiovascular: Normal rate, regular rhythm. Normal distal pulses. Respiratory: Normal respiratory effort. No wheezes/rales/rhonchi. GU: Normal external genitalia. Circumcised penis without urethral erythema or discharge. Palpable, soft, mobile fullness to the anterior pole of the right testes. No overlying skin changes. No inguinal hernias.  Musculoskeletal: Nontender with normal range of motion in all extremities.  Neurologic:  Normal gait  without ataxia. Normal speech and language. No gross focal neurologic deficits are appreciated. Skin:  Skin is warm, dry and intact. No rash noted. Psychiatric: Mood and affect are normal. Patient exhibits appropriate insight and judgment. ____________________________________________   LABS (pertinent positives/negatives) Labs Reviewed  URINALYSIS, COMPLETE (UACMP) WITH MICROSCOPIC - Abnormal; Notable for the following components:       Result Value   Color, Urine YELLOW (*)    APPearance CLEAR (*)    Ketones, ur 5 (*)    All other components within normal limits  ____________________________________________   RADIOLOGY  Scrotal US  IMPRESSION: Negative for testicular torsion or testicular mass lesion. Negative scrotal ultrasound. ____________________________________________  PROCEDURES  Procedures Decadron 10 mg IM ____________________________________________  INITIAL IMPRESSION / ASSESSMENT AND PLAN / ED COURSE  Patient with ED evaluation of persistent right scrotal/testicle swelling.  Patient's exam is overall benign.  His ultrasound is again reassuring as it shows no acute hydrocele, varicocele, epididymitis, or microlithiasis.  There is no significant or acute finding to explain the patient's subtle swelling.  He is again reassured by his negative urinalysis lab.  Decided at this time to forego any antibiotic therapy as the patient had some adverse effects from the previously prescribed doxycycline.  He will instead be discharged with a prescription for prednisone to dose as directed.  He is again encouraged to keep the appointment urology for August 22.  Return precautions have been again reviewed.  Patient is also encouraged to wear supportive underwear or jock strap while working. ____________________________________________  FINAL CLINICAL IMPRESSION(S) / ED DIAGNOSES  Final diagnoses:  Testicular swelling, right      Karmen StabsMenshew, Charlesetta IvoryJenise V Bacon, PA-C 02/25/18 2230    Emily FilbertWilliams, Jonathan E, MD 02/25/18 [phone]

## 2018-02-25 NOTE — Discharge Instructions (Signed)
Your exam and ultrasound are essentially normal at this time. Keep your appointment with Urology for further evaluation. Take the steroid as directed. Return to the Emergency Department as needed.

## 2018-02-25 NOTE — ED Triage Notes (Addendum)
Pt to ED reporting continued testicular pain for the past 2 months. Increased swelling and a knot noted to the right testicle.  No fevers. No pain in penis and no pain when urinating. Pt had an US 1 month ago that was unremarkable. No new injuries.

## 2018-03-19 ENCOUNTER — Ambulatory Visit: Payer: Self-pay | Admitting: Urology

## 2018-03-23 ENCOUNTER — Ambulatory Visit (INDEPENDENT_AMBULATORY_CARE_PROVIDER_SITE_OTHER): Payer: Self-pay | Admitting: Urology

## 2018-03-23 ENCOUNTER — Encounter: Payer: Self-pay | Admitting: Urology

## 2018-03-23 VITALS — BP 135/76 | HR 82 | Ht 74.0 in | Wt 227.6 lb

## 2018-03-23 DIAGNOSIS — N5082 Scrotal pain: Secondary | ICD-10-CM

## 2018-03-23 LAB — URINALYSIS, COMPLETE
Bilirubin, UA: NEGATIVE
Glucose, UA: NEGATIVE
Ketones, UA: NEGATIVE
LEUKOCYTES UA: NEGATIVE
Nitrite, UA: NEGATIVE
PH UA: 5.5 (ref 5.0–7.5)
Protein, UA: NEGATIVE
Specific Gravity, UA: >1.03 — ABNORMAL HIGH (ref 1.005–1.030)
Urobilinogen, Ur: 0.2 mg/dL (ref 0.2–1.0)

## 2018-03-23 LAB — MICROSCOPIC EXAMINATION
Epithelial Cells (non renal): NONE SEEN /hpf (ref 0–10)
RBC, UA: NONE SEEN /hpf (ref 0–2)
WBC UA: NONE SEEN /HPF (ref 0–5)

## 2018-03-23 NOTE — Progress Notes (Signed)
   03/23/2018 4:47 PM   Louis Doyle 11-09-1995 [id number]  CC: Right scrotal swelling/pain  HPI: I am seeing Louis Doyle in urology clinic today for right scrotal swelling.  Duration is 2 to 3 months.  He reports mild to moderate pain, however this is improved over the last few weeks.  He denies any inciting event.  There are no aggravating or alleviating factors.  Interestingly, he reports he had surgery on his testicles as a child, however he has not sure what for.  He does have bilateral inguinal scars on exam.  He denies any urinary symptoms, or gross hematuria.  He was recently seen in the emergency room, and two separate scrotal ultrasounds were benign.  Urine was noninfected, and STD testing was negative.  He works for a [employer], [employer].   PMH: Past Medical History:  Diagnosis Date  . Asthma     Surgical History: History reviewed. No pertinent surgical history.  Allergies:  Allergies  Allergen Reactions  . Amoxil [Amoxicillin]     Family History: Family History  Problem Relation Age of Onset  . Prostate cancer Neg Hx   . Bladder Cancer Neg Hx   . Kidney cancer Neg Hx     Social History:  reports that he has been smoking e-cigarettes. He has never used smokeless tobacco. He reports that he does not drink alcohol or use drugs.  ROS: Please see flowsheet from today's date for complete review of systems.  Physical Exam: BP 135/76 (BP Location: Left Arm, Patient Position: Sitting, Cuff Size: Normal)   Pulse 82   Ht 6\' 2"  (1.88 m)   Wt 227 lb 9.6 oz (103.2 kg)   BMI 29.22 kg/m    Constitutional:  Alert and oriented, No acute distress. Cardiovascular: No clubbing, cyanosis, or edema. Respiratory: Normal respiratory effort, no increased work of breathing. GI: Abdomen is soft, nontender, nondistended, no abdominal masses GU: No CVA tenderness, phallus without lesions, widely patent meatus. Bilateral inguinal scars. 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 Lymph: No cervical or inguinal lymphadenopathy. Skin: No rashes, bruises or suspicious lesions. Neurologic: Grossly intact, no focal deficits, moving all 4 extremities. Psychiatric: Normal mood and affect.  Laboratory Data: Urinalysis benign, STD testing negative.  Pertinent Imaging: I have personally reviewed the scrotal ultrasounds dated 01/23/2018 and 02/25/2018: Both are benign, with no testicular masses, no hydroceles, no varicoceles.  Assessment & Plan:   In summary, Louis Doyle is a healthy 22 year old male with a history of unclear bilateral inguinal surgeries as a child.  He reports approximately 2 months of right scrotal swelling and discomfort, however ultrasound and exam are benign.  I recommended considering a 2-week course of scheduled ibuprofen if his pain does not improve spontaneously.  I provided reassurance that we have no concerning findings for his scrotal pain.  Finally, could consider pelvic floor physical therapy in the future if he continues to have scrotal discomfort.   Return if symptoms worsen or fail to improve.  Sondra ComeBrian C Shariya Gaster, MD  [facility] [address], [address] [place], [place] [address] [phone]

## 2019-03-16 ENCOUNTER — Other Ambulatory Visit: Payer: Self-pay

## 2019-03-16 DIAGNOSIS — Z20822 Contact with and (suspected) exposure to covid-19: Secondary | ICD-10-CM

## 2019-03-17 LAB — NOVEL CORONAVIRUS, NAA: SARS-CoV-2, NAA: NOT DETECTED

## 2019-03-18 ENCOUNTER — Telehealth: Payer: Self-pay | Admitting: General Practice

## 2019-03-18 NOTE — Telephone Encounter (Signed)
Negative COVID results given. Patient results "NOT Detected." Caller expressed understanding. ° °

## 2019-06-15 ENCOUNTER — Other Ambulatory Visit: Payer: Self-pay

## 2019-06-15 DIAGNOSIS — Z20822 Contact with and (suspected) exposure to covid-19: Secondary | ICD-10-CM

## 2019-06-17 LAB — NOVEL CORONAVIRUS, NAA: SARS-CoV-2, NAA: DETECTED — AB

## 2020-02-06 IMAGING — US US SCROTUM
1 series · 14 of 25 positions shown · non-contrast
Comparison: None.

CLINICAL DATA: 21-year-old male with left testicular edema x1 week.

EXAM:
SCROTAL ULTRASOUND
DOPPLER ULTRASOUND OF THE TESTICLES
TECHNIQUE: Complete ultrasound examination of the testicles, epididymis, and
other scrotal structures was performed. Color and spectral Doppler
ultrasound were also utilized to evaluate blood flow to the
testicles.

[Series 1: us scrotum · 0.08mm/px · 14 of 104 slices shown]
[im 1/104]
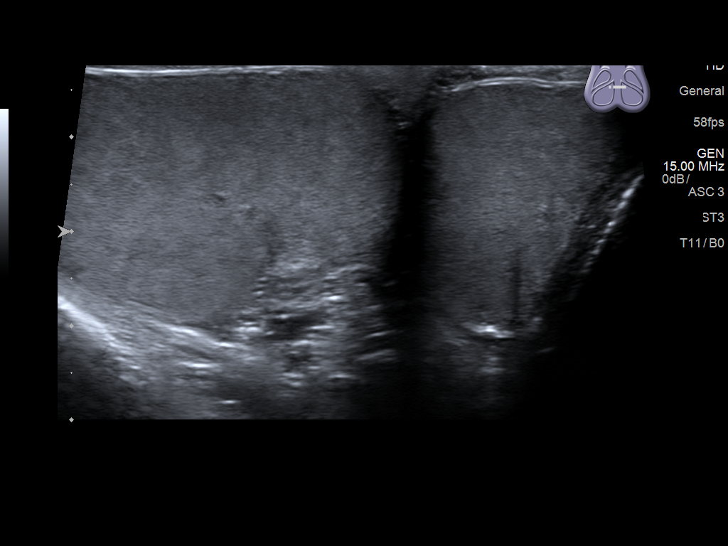
[im 9/104]
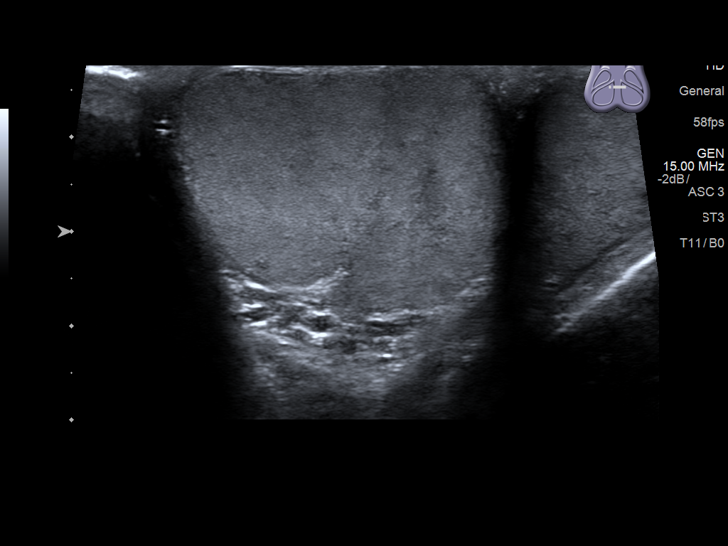
[im 18/104]
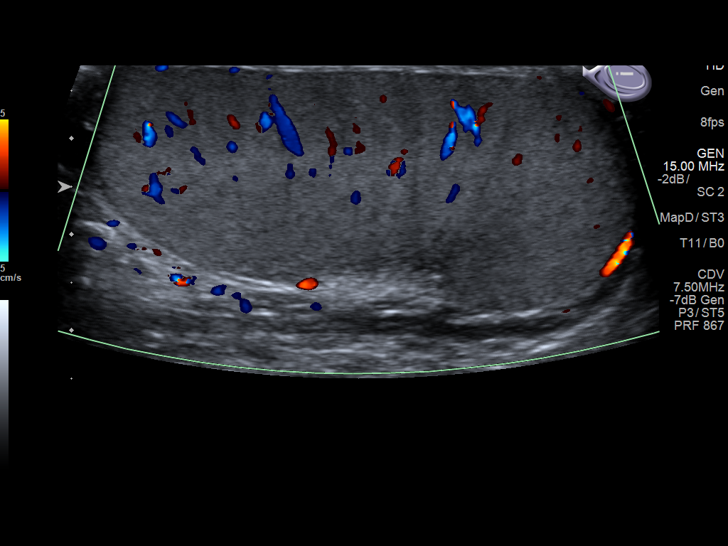
[im 26/104]
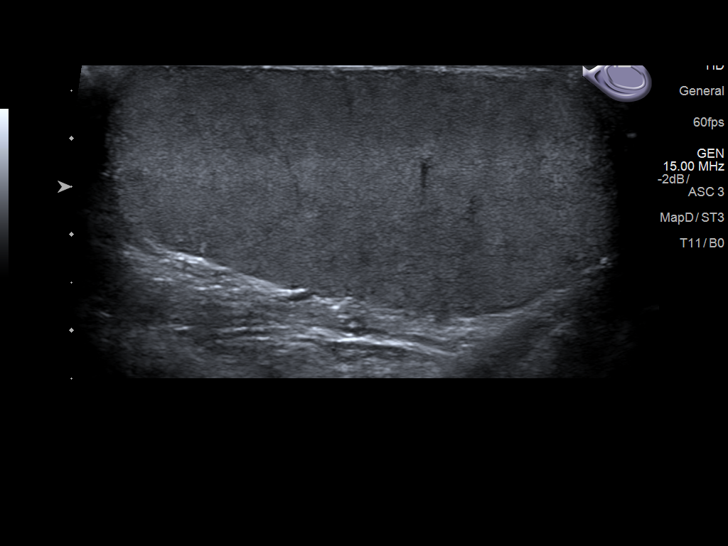
[im 35/104]
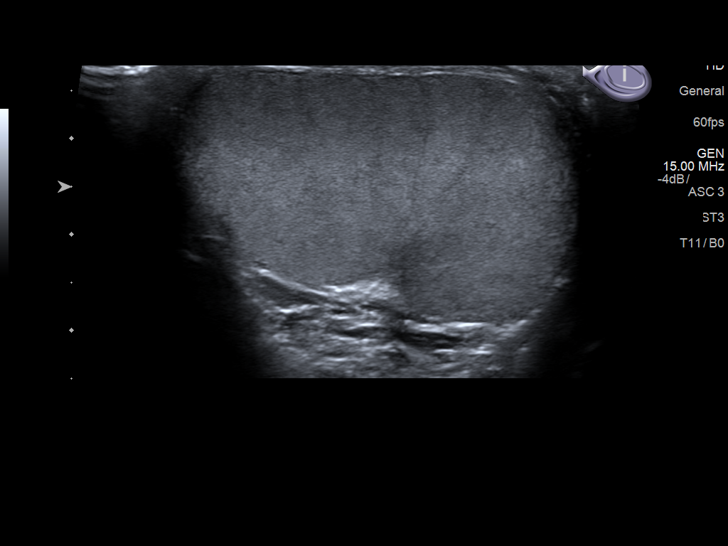
[im 39/104]
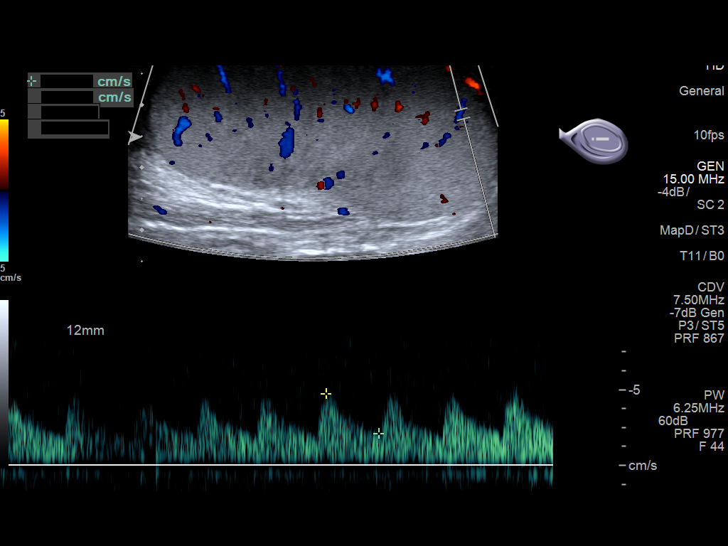
[im 48/104]
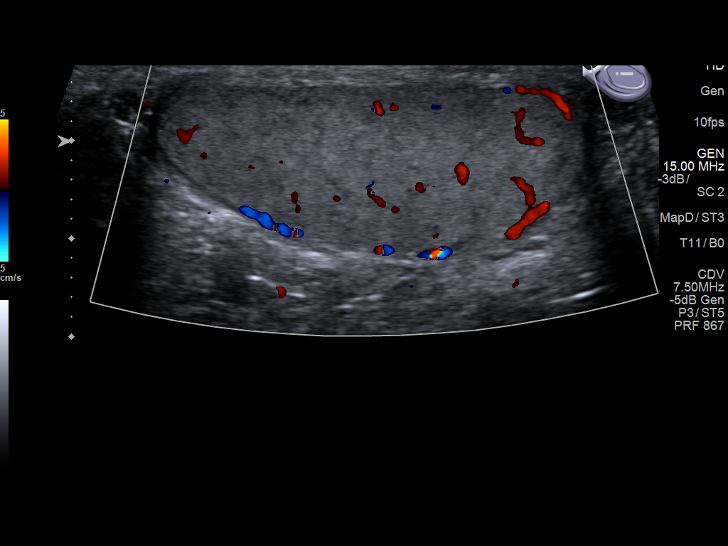
[im 56/104]
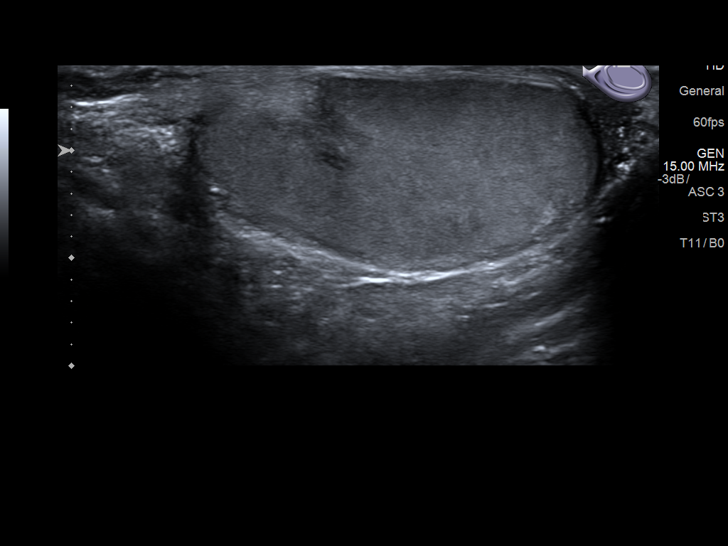
[im 65/104]
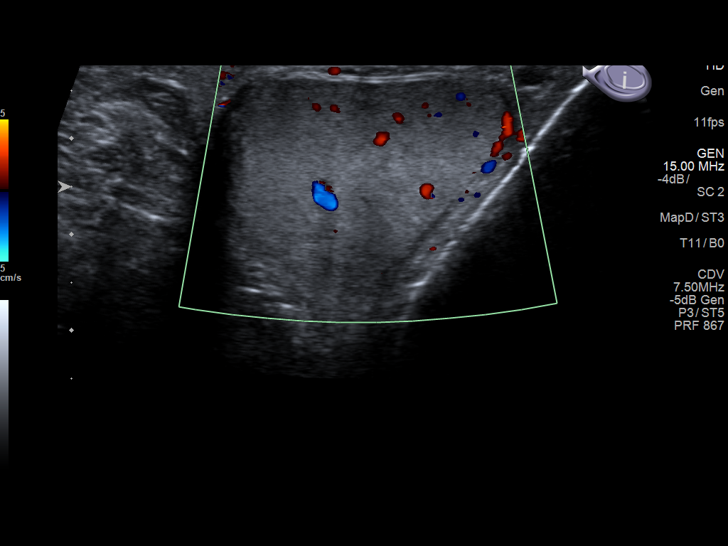
[im 69/104]
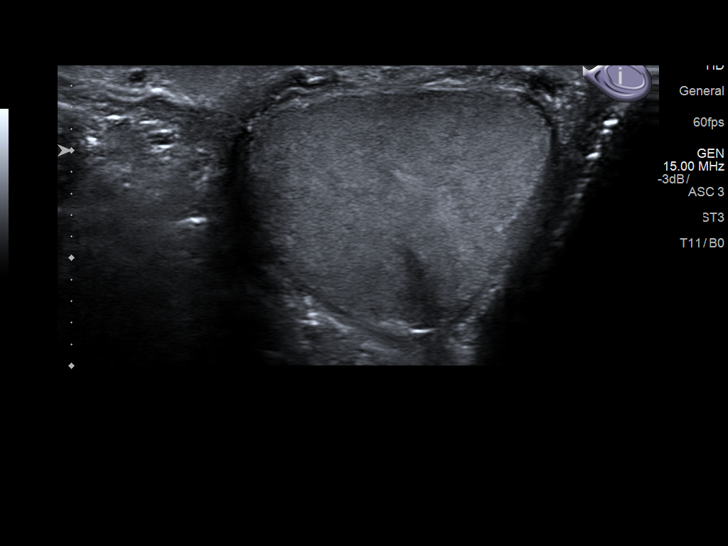
[im 78/104]
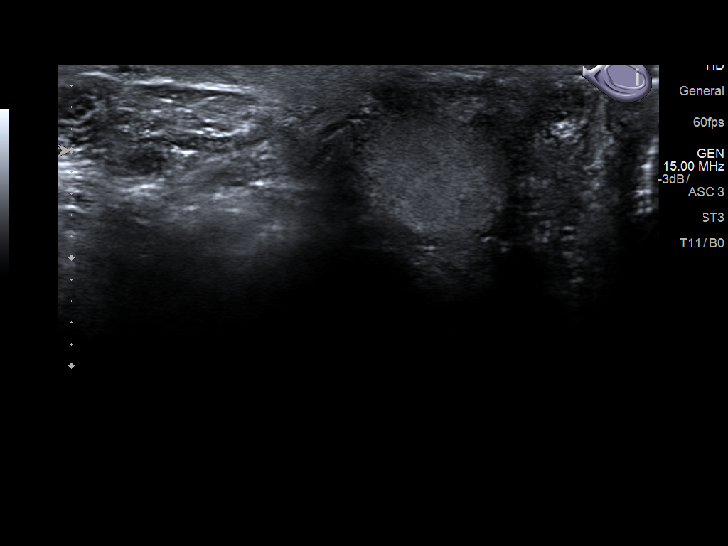
[im 86/104]
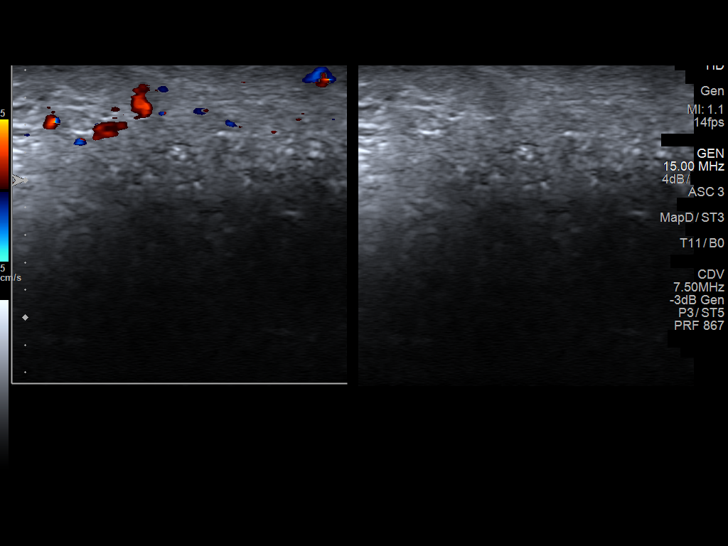
[im 95/104]
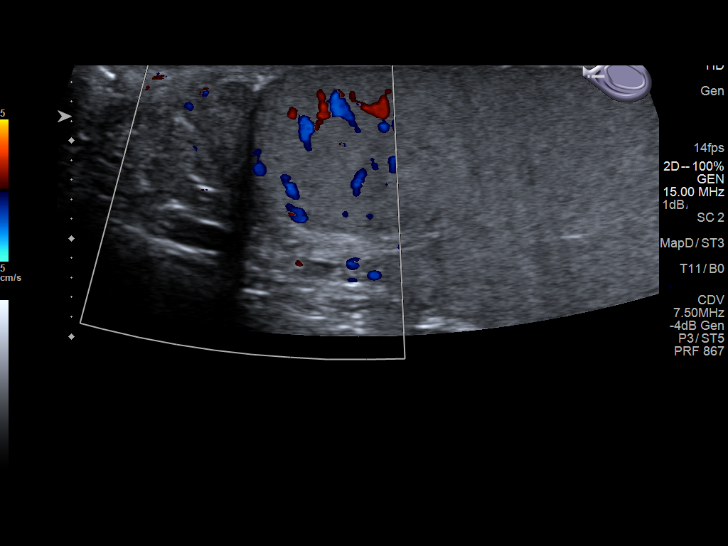
[im 104/104]
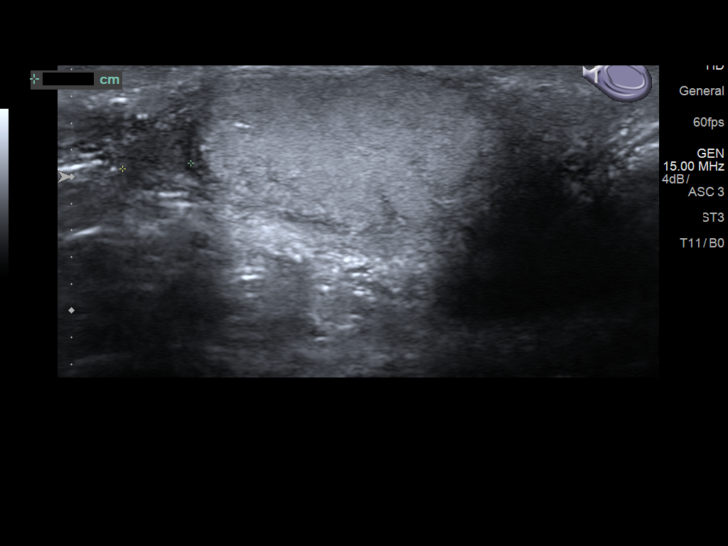

[14 of 25 positions shown; findings below may reference images not displayed]

FINDINGS: Right testicle

Measurements: 5.6 x 2.4 x 3.3 cm. No mass or microlithiasis
visualized.

Left testicle

Measurements: 4.8 x 1.9 x 2.7 cm. A subcentimeter echogenic area
measured in the testicle corresponds to the testicular mediastinum
and rete testes.

Right epididymis:  Normal in size and appearance.

Left epididymis:  Normal in size and appearance.

Hydrocele:  None visualized.

Varicocele:  None visualized.

Pulsed Doppler interrogation of both testes demonstrates normal low
resistance arterial and venous waveforms bilaterally.
IMPRESSION: Unremarkable testicular ultrasound. Doppler detected arterial and
venous flow bilaterally.

## 2020-03-10 IMAGING — US US SCROTUM W/ DOPPLER COMPLETE
1 series · 14 of 25 positions shown · non-contrast
Comparison: None.

CLINICAL DATA: Swelling and fullness to the right testicle

EXAM:
SCROTAL ULTRASOUND
DOPPLER ULTRASOUND OF THE TESTICLES
TECHNIQUE: Complete ultrasound examination of the testicles, epididymis, and
other scrotal structures was performed. Color and spectral Doppler
ultrasound were also utilized to evaluate blood flow to the
testicles.

[Series 1: us scrotum w/ doppler complete · 0.08mm/px · 14 of 62 slices shown]
[im 1/62]
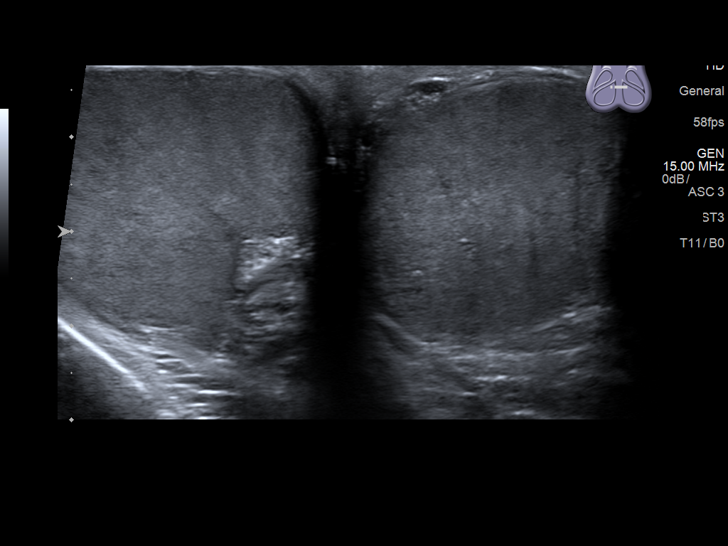
[im 6/62]
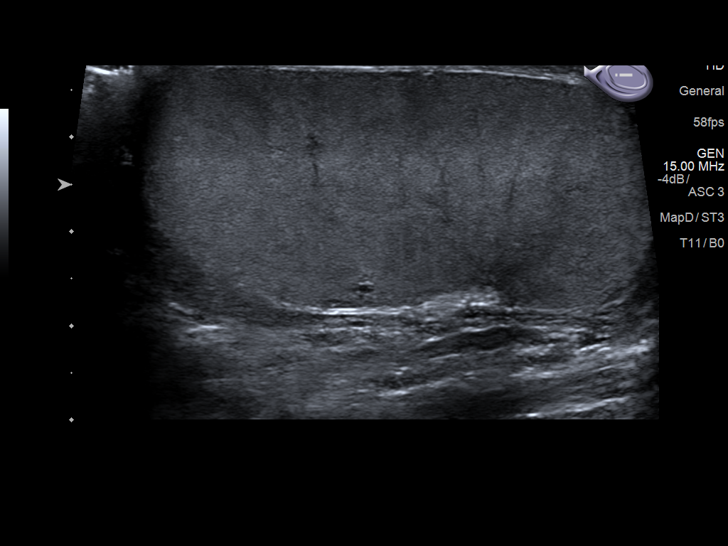
[im 11/62]
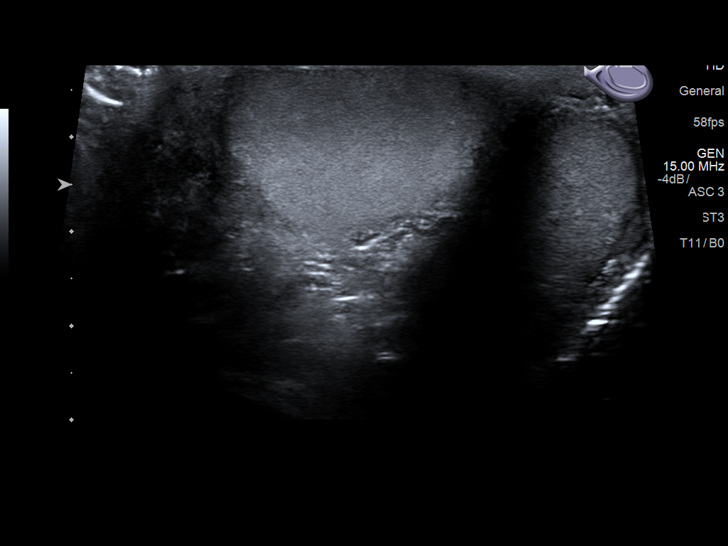
[im 16/62]
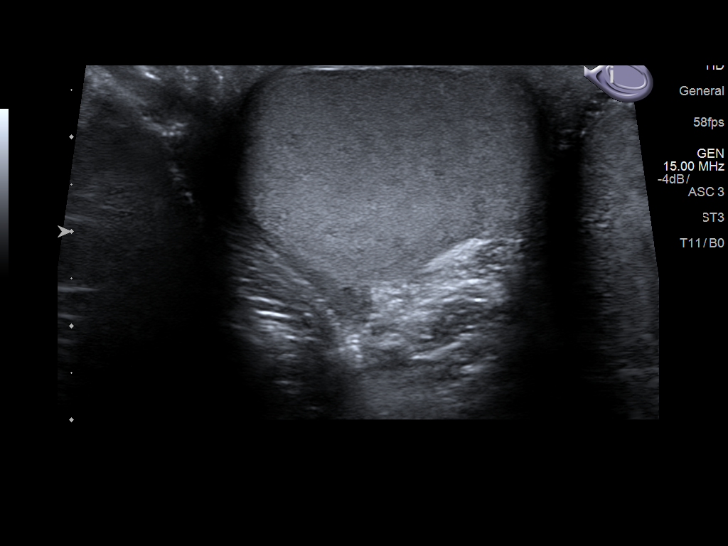
[im 21/62]
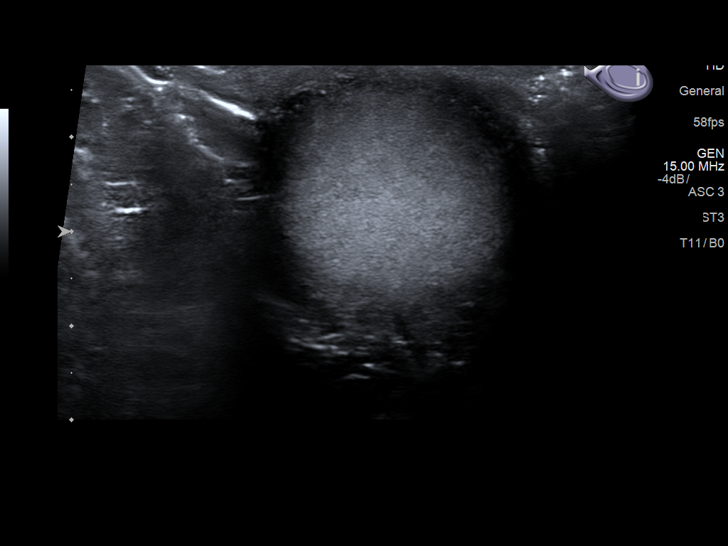
[im 23/62]
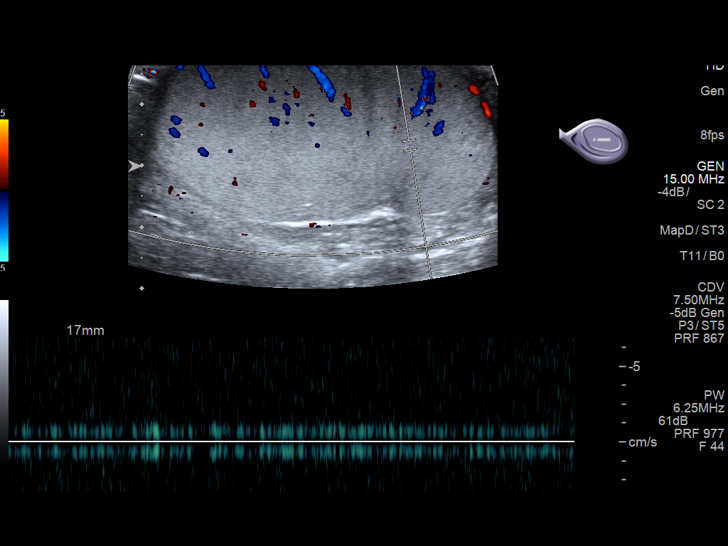
[im 28/62]
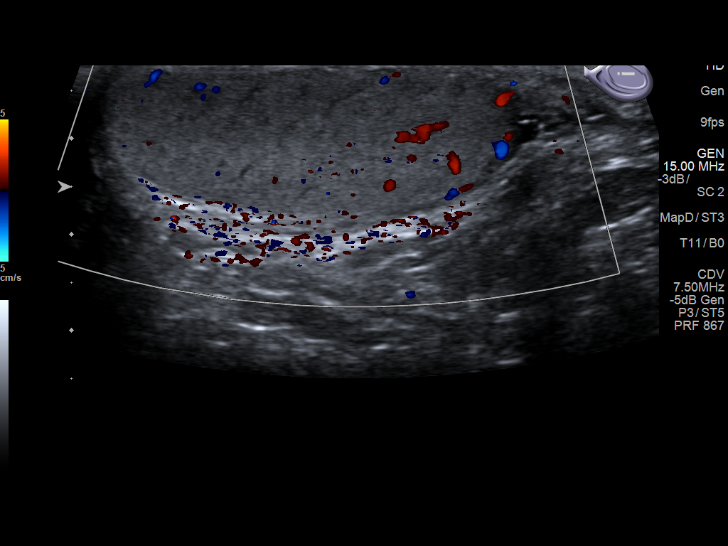
[im 34/62]
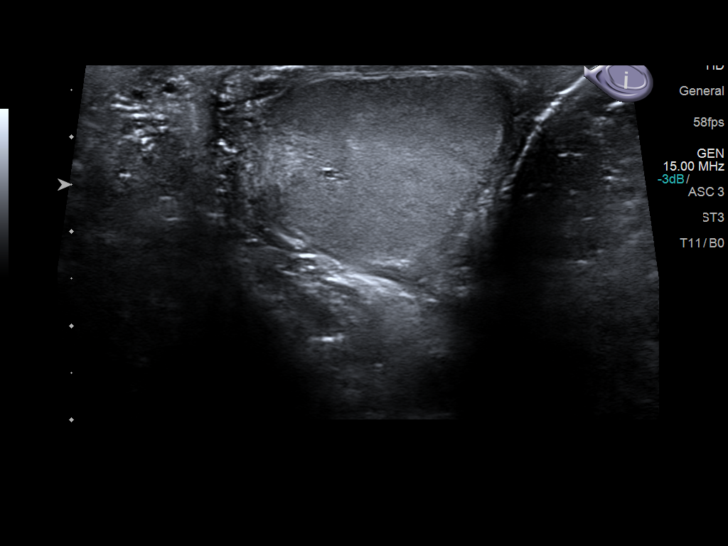
[im 39/62]
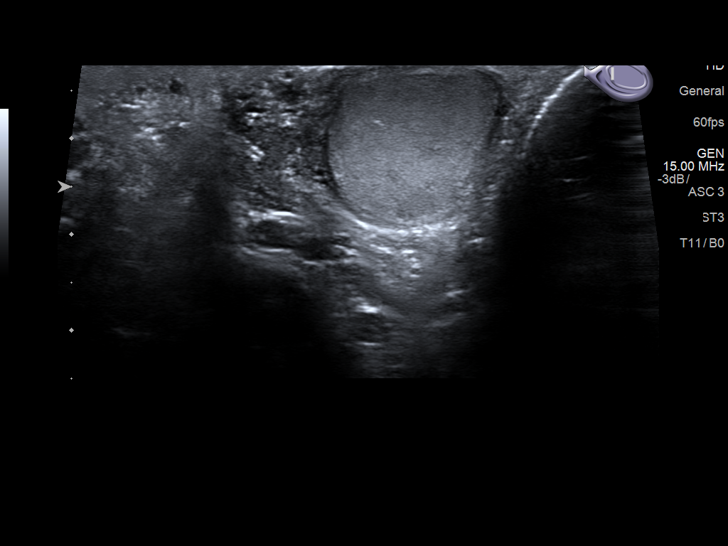
[im 41/62]
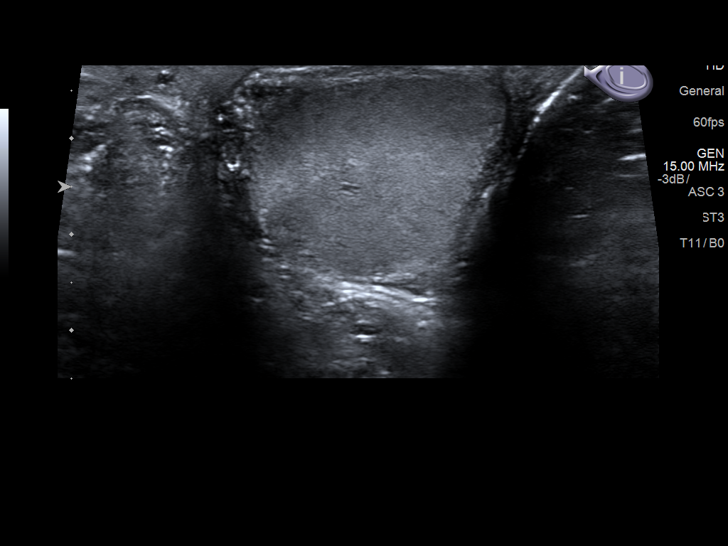
[im 46/62]
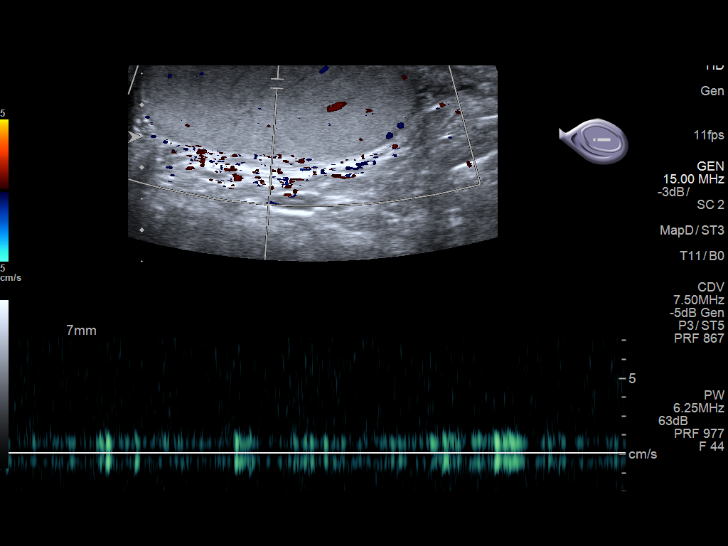
[im 51/62]
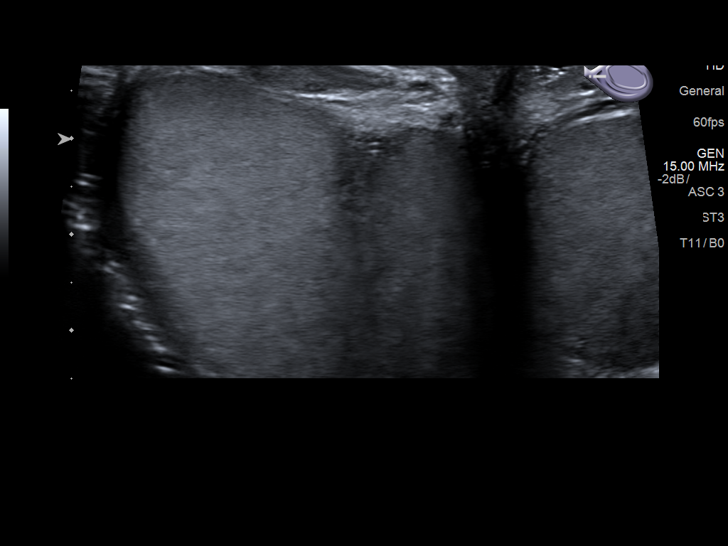
[im 56/62]
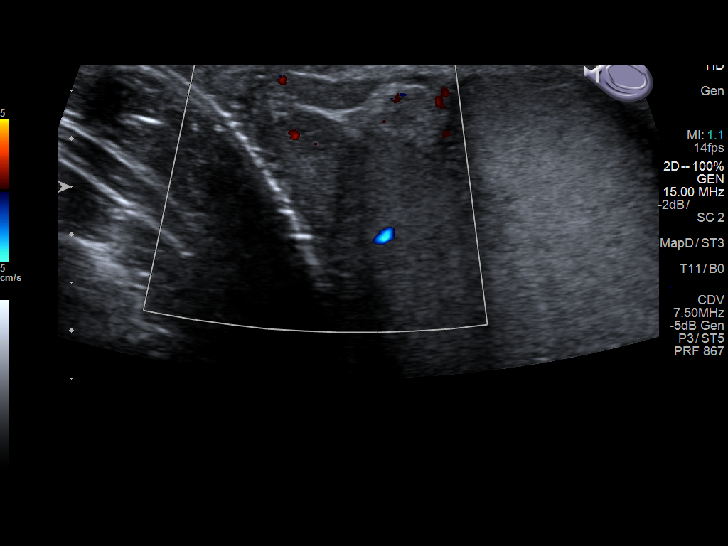
[im 62/62]
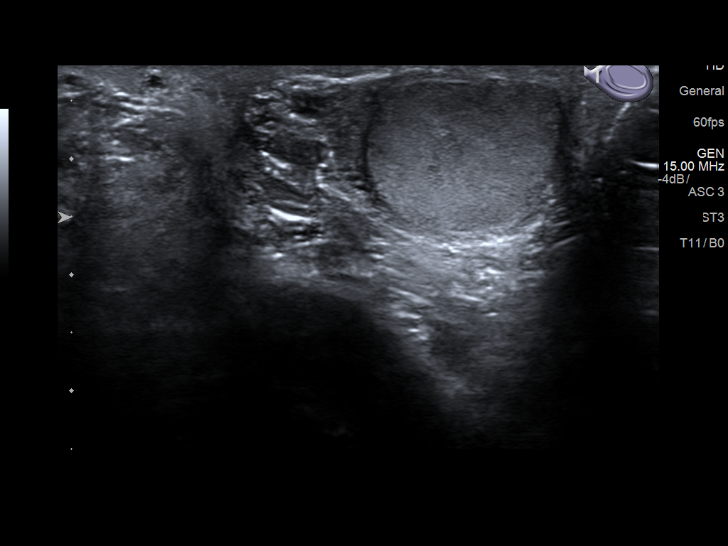

[14 of 25 positions shown; findings below may reference images not displayed]

FINDINGS: Right testicle

Measurements: 5.7 x 2.7 x 3.6 cm. No mass or microlithiasis
visualized.

Left testicle

Measurements: 4.6 x 1.7 x 2.6 cm. No mass or microlithiasis
visualized.

Right epididymis:  Normal in size and appearance.

Left epididymis:  Normal in size and appearance.

Hydrocele:  None visualized.

Varicocele:  None visualized.

Pulsed Doppler interrogation of both testes demonstrates normal low
resistance arterial and venous waveforms bilaterally.
IMPRESSION: Negative for testicular torsion or testicular mass lesion. Negative
scrotal ultrasound.

## 2021-09-05 ENCOUNTER — Other Ambulatory Visit: Payer: Self-pay

## 2021-09-05 ENCOUNTER — Ambulatory Visit (INDEPENDENT_AMBULATORY_CARE_PROVIDER_SITE_OTHER): Payer: 59 | Admitting: Urology

## 2021-09-05 ENCOUNTER — Encounter: Payer: Self-pay | Admitting: Urology

## 2021-09-05 VITALS — BP 120/74 | HR 67 | Ht 74.0 in | Wt 255.0 lb

## 2021-09-05 DIAGNOSIS — Z3009 Encounter for other general counseling and advice on contraception: Secondary | ICD-10-CM | POA: Diagnosis not present

## 2021-09-05 MED ORDER — DIAZEPAM 10 MG PO TABS: 10.0000 mg | ORAL_TABLET | Freq: Once | ORAL | 0 refills | Status: AC | PRN

## 2021-09-05 NOTE — Patient Instructions (Signed)

## 2021-09-05 NOTE — Progress Notes (Signed)
° °  09/05/21 10:13 AM   Louis Doyle 02-11-1996 [id number]  CC: Discuss vasectomy  HPI: 26 year old male here to discuss vasectomy.  He has 3 children ages 39, 53, and 6 months, and he and his wife do not desire any further biologic pregnancies.  I actually saw him in August 2019 for scrotal pain, and that resolved with 2 weeks of NSAIDs.  He does have a history of bilateral inguinal hernia repairs as an infant.  Family History: Family History  Problem Relation Age of Onset   Prostate cancer Neg Hx    Bladder Cancer Neg Hx    Kidney cancer Neg Hx     Social History:  reports that he has been smoking e-cigarettes. He has never been exposed to tobacco smoke. He has never used smokeless tobacco. He reports that he does not drink alcohol and does not use drugs.  Physical Exam: BP 120/74    Pulse 67    Ht 6\' 2"  (1.88 m)    Wt 255 lb (115.7 kg)    BMI 32.74 kg/m    Constitutional:  Alert and oriented, No acute distress. Cardiovascular: No clubbing, cyanosis, or edema. Respiratory: Normal respiratory effort, no increased work of breathing. 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  We discussed the risks and benefits of vasectomy at length.  Vasectomy is intended to be a permanent form of contraception, and does not produce immediate sterility.  Following vasectomy another form of contraception is required until vas occlusion is confirmed by a post-vasectomy semen analysis obtained 2-3 months after the procedure.  Even after vas occlusion is confirmed, vasectomy is not  100% reliable in preventing pregnancy, and the failure rate is approximately 07/1998.  Repeat vasectomy is required in less than 1% of patients.  He should refrain from ejaculation for 1 week after vasectomy.  Options for fertility after vasectomy include vasectomy reversal, and sperm retrieval with in vitro fertilization or ICSI.  These options are not always successful and may be expensive.  Finally, there are other permanent and non-permanent alternatives to vasectomy available. There is no risk of erectile dysfunction, and the volume of semen will be similar to prior, as the majority of the ejaculate is from the prostate and seminal vesicles.   The procedure takes ~20 minutes.  We recommend patients take 5-10 mg of Valium 30 minutes prior, and he will need a driver post-procedure.  Local anesthetic is injected into the scrotal skin and a small segment of the vas deferens is removed, and the ends occluded. The complication rate is approximately 1-2%, and includes bleeding, infection, and development of chronic scrotal pain.  PLAN: Schedule vasectomy Valium sent to pharmacy   08/1998, MD 09/05/2021  [facility] [address] [place], [place] [phone]

## 2021-10-03 ENCOUNTER — Encounter: Payer: 59 | Admitting: Urology

## 2022-09-27 ENCOUNTER — Telehealth: Payer: Self-pay | Admitting: *Deleted

## 2022-09-27 NOTE — Telephone Encounter (Signed)
Spoke with patient and he really wanted an estimate. Pt does not have [place], he is starting a new insurance and will call us back to schedule OV once he get his new card.

## 2022-09-27 NOTE — Telephone Encounter (Signed)
Pt calling stating that he would like to schedule VAS, pt had consult 08/2021. Ok to schedule another VAS consult? We would need new insurance and it's been over a year. Please advise
